# Patient Record
Sex: Female | Born: 1950 | Race: White | Hispanic: No | Marital: Single | Smoking: Never smoker
Health system: Southern US, Community
[De-identification: ages and names within clinical notes are randomized; demographics above are authoritative.]

## PROBLEM LIST (undated history)

## (undated) DIAGNOSIS — D170 Benign lipomatous neoplasm of skin and subcutaneous tissue of head, face and neck: Secondary | ICD-10-CM

## (undated) HISTORY — PX: COLONOSCOPY: SHX174

## (undated) HISTORY — PX: WISDOM TOOTH EXTRACTION: SHX21

---

## 2014-11-13 ENCOUNTER — Other Ambulatory Visit: Payer: Self-pay | Admitting: Family Medicine

## 2014-11-13 ENCOUNTER — Ambulatory Visit
Admission: RE | Admit: 2014-11-13 | Discharge: 2014-11-13 | Disposition: A | Discharge status: Home / Self Care | Payer: 59 | Source: Ambulatory Visit | Attending: Family Medicine | Admitting: Family Medicine

## 2014-11-13 DIAGNOSIS — M20001 Unspecified deformity of right finger(s): Secondary | ICD-10-CM

## 2017-01-27 DIAGNOSIS — N958 Other specified menopausal and perimenopausal disorders: Secondary | ICD-10-CM | POA: Diagnosis not present

## 2017-01-27 DIAGNOSIS — N6320 Unspecified lump in the left breast, unspecified quadrant: Secondary | ICD-10-CM | POA: Diagnosis not present

## 2017-01-27 DIAGNOSIS — M8588 Other specified disorders of bone density and structure, other site: Secondary | ICD-10-CM | POA: Diagnosis not present

## 2017-01-27 DIAGNOSIS — Z13820 Encounter for screening for osteoporosis: Secondary | ICD-10-CM | POA: Diagnosis not present

## 2017-01-27 DIAGNOSIS — Z1231 Encounter for screening mammogram for malignant neoplasm of breast: Secondary | ICD-10-CM | POA: Diagnosis not present

## 2017-01-27 DIAGNOSIS — N952 Postmenopausal atrophic vaginitis: Secondary | ICD-10-CM | POA: Diagnosis not present

## 2017-01-27 DIAGNOSIS — Z01419 Encounter for gynecological examination (general) (routine) without abnormal findings: Secondary | ICD-10-CM | POA: Diagnosis not present

## 2017-01-27 DIAGNOSIS — Z6825 Body mass index (BMI) 25.0-25.9, adult: Secondary | ICD-10-CM | POA: Diagnosis not present

## 2017-01-28 ENCOUNTER — Other Ambulatory Visit: Payer: Self-pay | Admitting: Obstetrics and Gynecology

## 2017-01-28 DIAGNOSIS — R928 Other abnormal and inconclusive findings on diagnostic imaging of breast: Secondary | ICD-10-CM

## 2017-02-01 ENCOUNTER — Ambulatory Visit
Admission: RE | Admit: 2017-02-01 | Discharge: 2017-02-01 | Disposition: A | Discharge status: Home / Self Care | Payer: Medicare Other | Source: Ambulatory Visit | Attending: Obstetrics and Gynecology | Admitting: Obstetrics and Gynecology

## 2017-02-01 DIAGNOSIS — R928 Other abnormal and inconclusive findings on diagnostic imaging of breast: Secondary | ICD-10-CM

## 2017-02-01 DIAGNOSIS — N6489 Other specified disorders of breast: Secondary | ICD-10-CM | POA: Diagnosis not present

## 2017-05-05 DIAGNOSIS — H10413 Chronic giant papillary conjunctivitis, bilateral: Secondary | ICD-10-CM | POA: Diagnosis not present

## 2017-06-22 DIAGNOSIS — Z23 Encounter for immunization: Secondary | ICD-10-CM | POA: Diagnosis not present

## 2017-06-22 DIAGNOSIS — M79672 Pain in left foot: Secondary | ICD-10-CM | POA: Diagnosis not present

## 2017-06-22 DIAGNOSIS — R221 Localized swelling, mass and lump, neck: Secondary | ICD-10-CM | POA: Diagnosis not present

## 2017-06-27 ENCOUNTER — Other Ambulatory Visit: Payer: Self-pay | Admitting: Family Medicine

## 2017-06-27 DIAGNOSIS — R221 Localized swelling, mass and lump, neck: Secondary | ICD-10-CM

## 2017-06-30 ENCOUNTER — Ambulatory Visit
Admission: RE | Admit: 2017-06-30 | Discharge: 2017-06-30 | Disposition: A | Discharge status: Home / Self Care | Payer: Medicare Other | Source: Ambulatory Visit | Attending: Family Medicine | Admitting: Family Medicine

## 2017-06-30 DIAGNOSIS — R221 Localized swelling, mass and lump, neck: Secondary | ICD-10-CM | POA: Diagnosis not present

## 2017-07-04 ENCOUNTER — Other Ambulatory Visit: Payer: Self-pay | Admitting: Family Medicine

## 2017-07-04 DIAGNOSIS — R221 Localized swelling, mass and lump, neck: Secondary | ICD-10-CM

## 2017-07-08 ENCOUNTER — Ambulatory Visit
Admission: RE | Admit: 2017-07-08 | Discharge: 2017-07-08 | Disposition: A | Discharge status: Home / Self Care | Payer: Medicare Other | Source: Ambulatory Visit | Attending: Family Medicine | Admitting: Family Medicine

## 2017-07-08 DIAGNOSIS — R221 Localized swelling, mass and lump, neck: Secondary | ICD-10-CM

## 2017-07-08 DIAGNOSIS — E041 Nontoxic single thyroid nodule: Secondary | ICD-10-CM | POA: Diagnosis not present

## 2017-07-08 MED ORDER — IOPAMIDOL (ISOVUE-300) INJECTION 61%
75.0000 mL | Freq: Once | INTRAVENOUS | Status: AC | PRN
  Administered 2017-07-08: 75 mL via INTRAVENOUS

## 2017-08-08 ENCOUNTER — Ambulatory Visit: Payer: Self-pay | Admitting: Surgery

## 2017-08-08 DIAGNOSIS — D170 Benign lipomatous neoplasm of skin and subcutaneous tissue of head, face and neck: Secondary | ICD-10-CM | POA: Diagnosis not present

## 2017-08-08 NOTE — H&P (Signed)
History of Present Illness Rachel Randall. Rachel Benzel MD; 08/08/2017 10:46 AM) The patient is a 66 year old female who presents with a complaint of Mass. Referred by Dr. Dorthy Cooler for left neck mass  This is a 66 year old female in good health who presents with a palpable mass on the left side of her neck. She first noticed this about a month ago. It has not enlarged since that time. There is minimal tenderness. She underwent a workup including ultrasound as well as CT scan of the neck to confirm that this is likely a subcutaneous lipoma measuring 3 x 2 cm. She presents now to discuss excision.  CLINICAL DATA: 66 year old female with a left anterior palpable abnormality.  EXAM: ULTRASOUND OF HEAD/NECK SOFT TISSUES  TECHNIQUE: Ultrasound examination of the head and neck soft tissues was performed in the area of clinical concern.  COMPARISON: None.  FINDINGS: Sonographic interrogation of the region of clinical concern demonstrates normal structures. The location of the palpable abnormality corresponds with a mildly distended jugular vein. The visualized portion of the thyroid gland is normal. The sternocleidomastoid muscle overlying the jugular vein also appears normal. No soft tissue or cystic mass identified.  IMPRESSION: Negative sonographic survey of the region of clinical concern.  The palpable abnormality appears to correspond with a mildly distended left jugular vein.   Electronically Signed By: Jacqulynn Cadet M.D. On: 06/30/2017 16:36  CLINICAL DATA: Neck nodule  Creatinine was obtained on site at [facility] at [address]Results: Creatinine 0.7 mg/dL.  EXAM: CT NECK WITH CONTRAST  TECHNIQUE: Multidetector CT imaging of the neck was performed using the standard protocol following the bolus administration of intravenous contrast.  CONTRAST: 54mL ISOVUE-300 IOPAMIDOL (ISOVUE-300) INJECTION 61%  COMPARISON: Neck ultrasound  06/30/2017  FINDINGS: Pharynx and larynx: Normal. No mass or swelling.  Salivary glands: No inflammation, mass, or stone.  Thyroid: Negative  Lymph nodes: No enlarged or pathologic lymph nodes  Vascular: Carotid artery and jugular vein patent bilaterally.  Limited intracranial: Negative  Visualized orbits: 90 image  Mastoids and visualized paranasal sinuses: Nodular soft tissue lesion in the right maxillary sinus appears to represent a mucous retention cyst. There are benign-appearing calcifications within the cyst. Air-fluid level right sphenoid sinus. Mastoid sinus clear bilaterally.  Skeleton: Disc degeneration and spurring C6-7. No acute skeletal abnormality.  Upper chest: Lung apices clear  Other: Vitamin-E capsule marks a palpable abnormality in the left anterior neck. This corresponds to a fatty lesion along the anterior margin of the sternocleidomastoid muscle. This has homogeneous fatty density with a thin soft tissue capsule. The lipoma measures 8 x 21 x 28 mm. No soft tissue nodularity or enhancement identified  IMPRESSION: Palpable abnormality corresponds to a lipoma along the anterior margin of the sternocleidomastoid muscle on the left. This has benign imaging characteristics. If this is symptomatic or growing, biopsy may be indicated to rule liposarcoma but there is no evidence of tumor on the imaging.   Electronically Signed By: Franchot Gallo M.D. On: 07/08/2017 16:19   Past Surgical History Alean Rinne, [place]; 08/08/2017 9:14 AM) No pertinent past surgical history  Diagnostic Studies History Alean Rinne, [place]; 08/08/2017 9:14 AM) Colonoscopy 1-5 years ago Mammogram within last year Pap Smear 1-5 years ago  Allergies Alean Rinne, [place]; 08/08/2017 9:16 AM) Penicillins Allergies Reconciled  Medication History Alean Rinne, RMA; 08/08/2017 9:16 AM) Multiple Vitamin (Oral) Active. Medications Reconciled  Social History  Alean Rinne, [place]; 08/08/2017 9:14 AM) Alcohol use Moderate alcohol use. Caffeine use Coffee, Tea. No  drug use Tobacco use Never smoker.  Family History Alean Rinne, [place]; 08/08/2017 9:14 AM) Arthritis Mother.  Pregnancy / Birth History Alean Rinne, [place]; 08/08/2017 9:14 AM) Age at menarche 38 years. Age of menopause 51-55 Contraceptive History Oral contraceptives. Gravida 1 Maternal age 39-25 Para 1  Other Problems Alean Rinne, [place]; 08/08/2017 9:14 AM) Arthritis     Review of Systems Alean Rinne RMA; 08/08/2017 9:14 AM) General Not Present- Appetite Loss, Chills, Fatigue, Fever, Night Sweats, Weight Gain and Weight Loss. HEENT Present- Wears glasses/contact lenses. Not Present- Earache, Hearing Loss, Hoarseness, Nose Bleed, Oral Ulcers, Ringing in the Ears, Seasonal Allergies, Sinus Pain, Sore Throat, Visual Disturbances and Yellow Eyes. Respiratory Not Present- Bloody sputum, Chronic Cough, Difficulty Breathing, Snoring and Wheezing. Breast Not Present- Breast Mass, Breast Pain, Nipple Discharge and Skin Changes. Cardiovascular Not Present- Chest Pain, Difficulty Breathing Lying Down, Leg Cramps, Palpitations, Rapid Heart Rate, Shortness of Breath and Swelling of Extremities. Gastrointestinal Not Present- Abdominal Pain, Bloating, Bloody Stool, Change in Bowel Habits, Chronic diarrhea, Constipation, Difficulty Swallowing, Excessive gas, Gets full quickly at meals, Hemorrhoids, Indigestion, Nausea, Rectal Pain and Vomiting. Female Genitourinary Not Present- Frequency, Nocturia, Painful Urination, Pelvic Pain and Urgency. Musculoskeletal Present- Joint Pain and Joint Stiffness. Not Present- Back Pain, Muscle Pain, Muscle Weakness and Swelling of Extremities. Neurological Not Present- Decreased Memory, Fainting, Headaches, Numbness, Seizures, Tingling, Tremor, Trouble walking and Weakness. Psychiatric Not Present- Anxiety, Bipolar, Change in Sleep Pattern,  Depression, Fearful and Frequent crying. Endocrine Not Present- Cold Intolerance, Excessive Hunger, Hair Changes, Heat Intolerance, Hot flashes and New Diabetes. Hematology Not Present- Blood Thinners, Easy Bruising, Excessive bleeding, Gland problems, HIV and Persistent Infections.  Vitals Mardene Celeste King RMA; 08/08/2017 9:15 AM) 08/08/2017 9:15 AM Weight: 146.8 lb Height: 63in Body Surface Area: 1.7 m Body Mass Index: 26 kg/m  Temp.: 98.73F  Pulse: 77 (Regular)  BP: 120/72 (Sitting, Left Arm, Standard)      Physical Exam Rodman Key K. Leeum Sankey MD; 08/08/2017 10:46 AM)  The physical exam findings are as follows: Note:WDWN in NAD Left neck over L SCM - palpable 3 cm subcutaneous mass - smooth, mobile, no skin changes    Assessment & Plan Rodman Key K. 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 The surgical procedure has been discussed with the patient. Potential risks, benefits, alternative treatments, and expected outcomes have been explained. All of the patient's questions at this time have been answered. The likelihood of reaching the patient's treatment goal is good. The patient understand the proposed surgical procedure and wishes to proceed.  Rachel Randall. Georgette Dover, MD, [facility]  General/ Trauma Surgery  08/08/2017 10:47 AM

## 2017-10-12 ENCOUNTER — Encounter (HOSPITAL_BASED_OUTPATIENT_CLINIC_OR_DEPARTMENT_OTHER): Payer: Self-pay | Admitting: *Deleted

## 2017-10-12 ENCOUNTER — Other Ambulatory Visit: Payer: Self-pay

## 2017-10-19 ENCOUNTER — Ambulatory Visit (HOSPITAL_BASED_OUTPATIENT_CLINIC_OR_DEPARTMENT_OTHER)
Admission: RE | Admit: 2017-10-19 | Discharge: 2017-10-19 | Disposition: A | Discharge status: Home / Self Care | Payer: Medicare Other | Source: Ambulatory Visit | Attending: Surgery | Admitting: Surgery

## 2017-10-19 ENCOUNTER — Encounter (HOSPITAL_BASED_OUTPATIENT_CLINIC_OR_DEPARTMENT_OTHER): Payer: Self-pay | Admitting: Anesthesiology

## 2017-10-19 ENCOUNTER — Encounter (HOSPITAL_BASED_OUTPATIENT_CLINIC_OR_DEPARTMENT_OTHER)
Admission: RE | Disposition: A | Discharge status: Home / Self Care | Payer: Self-pay | Source: Ambulatory Visit | Attending: Surgery

## 2017-10-19 ENCOUNTER — Ambulatory Visit (HOSPITAL_BASED_OUTPATIENT_CLINIC_OR_DEPARTMENT_OTHER): Payer: Medicare Other | Admitting: Anesthesiology

## 2017-10-19 ENCOUNTER — Other Ambulatory Visit: Payer: Self-pay

## 2017-10-19 DIAGNOSIS — D1779 Benign lipomatous neoplasm of other sites: Secondary | ICD-10-CM | POA: Insufficient documentation

## 2017-10-19 DIAGNOSIS — Z79899 Other long term (current) drug therapy: Secondary | ICD-10-CM | POA: Insufficient documentation

## 2017-10-19 DIAGNOSIS — D170 Benign lipomatous neoplasm of skin and subcutaneous tissue of head, face and neck: Secondary | ICD-10-CM | POA: Diagnosis not present

## 2017-10-19 HISTORY — DX: Benign lipomatous neoplasm of skin and subcutaneous tissue of head, face and neck: D17.0

## 2017-10-19 HISTORY — PX: LIPOMA EXCISION: SHX5283

## 2017-10-19 SURGERY — EXCISION LIPOMA
Anesthesia: General | Site: Neck | Laterality: Left

## 2017-10-19 MED ORDER — LACTATED RINGERS IV SOLN
INTRAVENOUS | Status: DC
  Administered 2017-10-19 (×3): via INTRAVENOUS

## 2017-10-19 MED ORDER — OXYCODONE HCL 5 MG PO TABS: 5.0000 mg | ORAL_TABLET | Freq: Once | ORAL | Status: DC | PRN

## 2017-10-19 MED ORDER — CEFAZOLIN SODIUM-DEXTROSE 2-4 GM/100ML-% IV SOLN
INTRAVENOUS | Status: AC
  Filled 2017-10-19: qty 100

## 2017-10-19 MED ORDER — DEXAMETHASONE SODIUM PHOSPHATE 10 MG/ML IJ SOLN
INTRAMUSCULAR | Status: AC
  Filled 2017-10-19: qty 1

## 2017-10-19 MED ORDER — MEPERIDINE HCL 25 MG/ML IJ SOLN: 6.2500 mg | INTRAMUSCULAR | Status: DC | PRN

## 2017-10-19 MED ORDER — PROMETHAZINE HCL 25 MG/ML IJ SOLN: 6.2500 mg | INTRAMUSCULAR | Status: DC | PRN

## 2017-10-19 MED ORDER — CEFAZOLIN SODIUM-DEXTROSE 2-4 GM/100ML-% IV SOLN
2.0000 g | INTRAVENOUS | Status: AC
  Administered 2017-10-19: 2 g via INTRAVENOUS

## 2017-10-19 MED ORDER — HYDROCODONE-ACETAMINOPHEN 5-325 MG PO TABS: 1.0000 | ORAL_TABLET | Freq: Four times a day (QID) | ORAL | 0 refills | Status: DC | PRN

## 2017-10-19 MED ORDER — PHENYLEPHRINE HCL 10 MG/ML IJ SOLN
INTRAMUSCULAR | Status: DC | PRN
  Administered 2017-10-19: 160 ug via INTRAVENOUS

## 2017-10-19 MED ORDER — CHLORHEXIDINE GLUCONATE CLOTH 2 % EX PADS: 6.0000 | MEDICATED_PAD | Freq: Once | CUTANEOUS | Status: DC

## 2017-10-19 MED ORDER — ONDANSETRON HCL 4 MG/2ML IJ SOLN
INTRAMUSCULAR | Status: AC
  Filled 2017-10-19: qty 2

## 2017-10-19 MED ORDER — FENTANYL CITRATE (PF) 100 MCG/2ML IJ SOLN: 50.0000 ug | INTRAMUSCULAR | Status: DC | PRN

## 2017-10-19 MED ORDER — FENTANYL CITRATE (PF) 100 MCG/2ML IJ SOLN
INTRAMUSCULAR | Status: DC | PRN
  Administered 2017-10-19: 100 ug via INTRAVENOUS

## 2017-10-19 MED ORDER — SCOPOLAMINE 1 MG/3DAYS TD PT72: 1.0000 | MEDICATED_PATCH | Freq: Once | TRANSDERMAL | Status: DC | PRN

## 2017-10-19 MED ORDER — ONDANSETRON HCL 4 MG/2ML IJ SOLN
INTRAMUSCULAR | Status: DC | PRN
  Administered 2017-10-19: 4 mg via INTRAVENOUS

## 2017-10-19 MED ORDER — FENTANYL CITRATE (PF) 100 MCG/2ML IJ SOLN: 25.0000 ug | INTRAMUSCULAR | Status: DC | PRN

## 2017-10-19 MED ORDER — PROPOFOL 10 MG/ML IV BOLUS
INTRAVENOUS | Status: DC | PRN
  Administered 2017-10-19: 200 mg via INTRAVENOUS

## 2017-10-19 MED ORDER — DEXAMETHASONE SODIUM PHOSPHATE 4 MG/ML IJ SOLN
INTRAMUSCULAR | Status: DC | PRN
  Administered 2017-10-19: 10 mg via INTRAVENOUS

## 2017-10-19 MED ORDER — LIDOCAINE HCL (CARDIAC) 20 MG/ML IV SOLN
INTRAVENOUS | Status: DC | PRN
  Administered 2017-10-19: 30 mg via INTRAVENOUS

## 2017-10-19 MED ORDER — PROPOFOL 500 MG/50ML IV EMUL
INTRAVENOUS | Status: AC
  Filled 2017-10-19: qty 50

## 2017-10-19 MED ORDER — MIDAZOLAM HCL 2 MG/2ML IJ SOLN: 1.0000 mg | INTRAMUSCULAR | Status: DC | PRN

## 2017-10-19 MED ORDER — FENTANYL CITRATE (PF) 100 MCG/2ML IJ SOLN
INTRAMUSCULAR | Status: AC
  Filled 2017-10-19: qty 2

## 2017-10-19 MED ORDER — PROPOFOL 500 MG/50ML IV EMUL
INTRAVENOUS | Status: AC
  Filled 2017-10-19: qty 100

## 2017-10-19 MED ORDER — BUPIVACAINE-EPINEPHRINE 0.25% -1:200000 IJ SOLN
INTRAMUSCULAR | Status: DC | PRN
  Administered 2017-10-19: 7 mL

## 2017-10-19 MED ORDER — MIDAZOLAM HCL 2 MG/2ML IJ SOLN
INTRAMUSCULAR | Status: AC
  Filled 2017-10-19: qty 2

## 2017-10-19 MED ORDER — MIDAZOLAM HCL 5 MG/5ML IJ SOLN
INTRAMUSCULAR | Status: DC | PRN
  Administered 2017-10-19: 2 mg via INTRAVENOUS

## 2017-10-19 MED ORDER — LIDOCAINE 2% (20 MG/ML) 5 ML SYRINGE
INTRAMUSCULAR | Status: AC
  Filled 2017-10-19: qty 5

## 2017-10-19 MED ORDER — OXYCODONE HCL 5 MG/5ML PO SOLN: 5.0000 mg | Freq: Once | ORAL | Status: DC | PRN

## 2017-10-19 MED ORDER — PHENYLEPHRINE 40 MCG/ML (10ML) SYRINGE FOR IV PUSH (FOR BLOOD PRESSURE SUPPORT)
PREFILLED_SYRINGE | INTRAVENOUS | Status: AC
  Filled 2017-10-19: qty 10

## 2017-10-19 SURGICAL SUPPLY — 47 items
BENZOIN TINCTURE PRP APPL 2/3 (GAUZE/BANDAGES/DRESSINGS) ×3 IMPLANT
BLADE CLIPPER SURG (BLADE) IMPLANT
BLADE SURG 15 STRL LF DISP TIS (BLADE) ×1 IMPLANT
BLADE SURG 15 STRL SS (BLADE) ×2
CANISTER SUCT 1200ML W/VALVE (MISCELLANEOUS) IMPLANT
CHLORAPREP W/TINT 26ML (MISCELLANEOUS) ×3 IMPLANT
CLOSURE WOUND 1/2 X4 (GAUZE/BANDAGES/DRESSINGS) ×1
COVER BACK TABLE 60X90IN (DRAPES) ×3 IMPLANT
COVER MAYO STAND STRL (DRAPES) ×3 IMPLANT
DECANTER SPIKE VIAL GLASS SM (MISCELLANEOUS) IMPLANT
DRAPE LAPAROTOMY 100X72 PEDS (DRAPES) ×3 IMPLANT
DRAPE UTILITY XL STRL (DRAPES) ×3 IMPLANT
DRSG TEGADERM 4X4.75 (GAUZE/BANDAGES/DRESSINGS) ×3 IMPLANT
ELECT COATED BLADE 2.86 ST (ELECTRODE) ×3 IMPLANT
ELECT REM PT RETURN 9FT ADLT (ELECTROSURGICAL) ×3
ELECTRODE REM PT RTRN 9FT ADLT (ELECTROSURGICAL) ×1 IMPLANT
GAUZE SPONGE 4X4 12PLY STRL LF (GAUZE/BANDAGES/DRESSINGS) IMPLANT
GLOVE BIO SURGEON STRL SZ 6.5 (GLOVE) ×2 IMPLANT
GLOVE BIO SURGEON STRL SZ7 (GLOVE) ×3 IMPLANT
GLOVE BIO SURGEONS STRL SZ 6.5 (GLOVE) ×1
GLOVE BIOGEL PI IND STRL 7.0 (GLOVE) ×2 IMPLANT
GLOVE BIOGEL PI IND STRL 7.5 (GLOVE) ×1 IMPLANT
GLOVE BIOGEL PI INDICATOR 7.0 (GLOVE) ×4
GLOVE BIOGEL PI INDICATOR 7.5 (GLOVE) ×2
GOWN STRL REUS W/ TWL LRG LVL3 (GOWN DISPOSABLE) ×2 IMPLANT
GOWN STRL REUS W/TWL LRG LVL3 (GOWN DISPOSABLE) ×4
NEEDLE HYPO 25X1 1.5 SAFETY (NEEDLE) ×3 IMPLANT
NS IRRIG 1000ML POUR BTL (IV SOLUTION) IMPLANT
PACK BASIN DAY SURGERY FS (CUSTOM PROCEDURE TRAY) ×3 IMPLANT
PENCIL BUTTON HOLSTER BLD 10FT (ELECTRODE) ×3 IMPLANT
SLEEVE SCD COMPRESS KNEE MED (MISCELLANEOUS) IMPLANT
SPONGE GAUZE 2X2 8PLY STER LF (GAUZE/BANDAGES/DRESSINGS)
SPONGE GAUZE 2X2 8PLY STRL LF (GAUZE/BANDAGES/DRESSINGS) IMPLANT
STRIP CLOSURE SKIN 1/2X4 (GAUZE/BANDAGES/DRESSINGS) ×2 IMPLANT
SUT MON AB 4-0 PC3 18 (SUTURE) IMPLANT
SUT PROLENE 6 0 P 1 18 (SUTURE) IMPLANT
SUT SILK 2 0 PERMA HAND 18 BK (SUTURE) IMPLANT
SUT VIC AB 3-0 SH 27 (SUTURE)
SUT VIC AB 3-0 SH 27X BRD (SUTURE) IMPLANT
SUT VICRYL 3-0 CR8 SH (SUTURE) IMPLANT
SYR BULB 3OZ (MISCELLANEOUS) ×3 IMPLANT
SYR CONTROL 10ML LL (SYRINGE) ×3 IMPLANT
TOWEL OR 17X24 6PK STRL BLUE (TOWEL DISPOSABLE) ×3 IMPLANT
TOWEL OR NON WOVEN STRL DISP B (DISPOSABLE) ×3 IMPLANT
TUBE CONNECTING 20'X1/4 (TUBING)
TUBE CONNECTING 20X1/4 (TUBING) IMPLANT
YANKAUER SUCT BULB TIP NO VENT (SUCTIONS) IMPLANT

## 2017-10-19 NOTE — Op Note (Signed)
Preop diagnosis: Subcutaneous lipoma left neck (3 x 2 cm) Postop diagnosis: Subplatysmal lipoma left neck ( 3 x 2 cm) Procedure performed: Excision of subfascial lipoma left neck Surgeon: Maia Petties, MD Anesthesia: General via LMA Indications:This is a 67 year old female in good health who presents with a palpable mass on the left side of her neck. She first noticed this about a month ago. It has not enlarged since that time. There is minimal tenderness. She underwent a workup including ultrasound as well as CT scan of the neck to confirm that this is likely a subcutaneous lipoma measuring 3 x 2 cm. She presents now to discuss excision  Description of procedure: The patient is brought to the operating room and placed in the supine position on the operating room table.  After an adequate level of general anesthesia was obtained, her head was turned to the right.  Her left neck was prepped with ChloraPrep and draped in sterile fashion.  A timeout was taken to ensure the proper patient and proper procedure.  The patient has a natural skin fold that goes over the middle of this mass.  We anesthetized with 0.25% Marcaine with epinephrine and then I made a transverse incision along this natural skin line.  We dissected down to the platysma.  The platysma fibers were divided.  We then bluntly dissected around the underlying mass and dissected off of the underlying muscle.  The mass was removed entirely intact and was sent for pathologic examination.  Grossly, it appears to be a lipoma.  We examined for hemostasis.  The wound was closed with a platysmal layer of 3-0 Vicryl and a subcuticular layer 4-0 Monocryl.  Benzoin Steri-Strips were applied.  The patient was then extubated and brought to recovery room in stable condition.  All sponge, instrument, and needle counts are correct.  Imogene Burn. Georgette Dover, MD, [facility]  General/ Trauma Surgery  10/19/2017 10:15 AM

## 2017-10-19 NOTE — Transfer of Care (Signed)
Immediate Anesthesia Transfer of Care Note  Patient: Rachel Randall  Procedure(s) Performed: EXCISION OF LEFT NECK LIPOMA (Left Neck)  Patient Location: PACU  Anesthesia Type:General  Level of Consciousness: sedated  Airway & Oxygen Therapy: Patient Spontanous Breathing and Patient connected to face mask oxygen  Post-op Assessment: Report given to RN and Post -op Vital signs reviewed and stable  Post vital signs: Reviewed and stable  Last Vitals:  Vitals:   10/19/17 0830  BP: (!) 118/93  Pulse: (!) 52  Resp: 16  Temp: 36.6 C  SpO2: 100%    Last Pain:  Vitals:   10/19/17 0830  TempSrc: Oral         Complications: No apparent anesthesia complications

## 2017-10-19 NOTE — Anesthesia Procedure Notes (Signed)
Procedure Name: LMA Insertion Date/Time: 10/19/2017 9:25 AM Performed by: Marrianne Mood, CRNA Pre-anesthesia Checklist: Patient identified, Emergency Drugs available, Suction available, Patient being monitored and Timeout performed Patient Re-evaluated:Patient Re-evaluated prior to induction Oxygen Delivery Method: Circle system utilized Preoxygenation: Pre-oxygenation with 100% oxygen Induction Type: IV induction Ventilation: Mask ventilation without difficulty LMA: LMA inserted LMA Size: 4.0 Number of attempts: 1 Airway Equipment and Method: Bite block Placement Confirmation: positive ETCO2 Tube secured with: Tape Dental Injury: Teeth and Oropharynx as per pre-operative assessment

## 2017-10-19 NOTE — Discharge Instructions (Signed)
[facility] °Office Phone Number [phone] ° °Lipoma Excision: POST OP INSTRUCTIONS ° °Always review your discharge instruction sheet given to you by the facility where your surgery was performed. ° °IF YOU HAVE DISABILITY OR FAMILY LEAVE FORMS, YOU MUST BRING THEM TO THE OFFICE FOR PROCESSING.  DO NOT GIVE THEM TO YOUR DOCTOR. ° °1. A prescription for pain medication may be given to you upon discharge.  Take your pain medication as prescribed, if needed.  If narcotic pain medicine is not needed, then you may take acetaminophen (Tylenol) or ibuprofen (Advil) as needed. °2. Take your usually prescribed medications unless otherwise directed °3. If you need a refill on your pain medication, please contact your pharmacy.  They will contact our office to request authorization.  Prescriptions will not be filled after 5pm or on week-ends. °4. You should eat very light the first 24 hours after surgery, such as soup, crackers, pudding, etc.  Resume your normal diet the [place] after surgery. °5. Most patients will experience some swelling and bruising around the surgical site.  Ice packs will help.  Swelling and bruising can take several days to resolve.  °6. It is common to experience some constipation if taking pain medication after surgery.  Increasing fluid intake and taking a stool softener will usually help or prevent this problem from occurring.  A mild laxative (Milk of Magnesia or Miralax) should be taken according to package directions if there are no bowel movements after 48 hours. °7. You may remove your bandages 48 hours after surgery, and you may shower at that time.  You will have steri-strips (small skin tapes) in place directly over the incision.  These strips should be left on the skin for 7-10 days.   °8. ACTIVITIES:  You may resume regular daily activities (gradually increasing) beginning the next [place].   You may have sexual intercourse when it is comfortable. °a. You may drive when you no  longer are taking prescription pain medication, you can comfortably wear a seatbelt, and you can safely maneuver your car and apply brakes. °b. RETURN TO WORK:  1-2 weeks °9. You should see your doctor in the office for a follow-up appointment approximately two to three weeks after your surgery.   ° °WHEN TO CALL YOUR DOCTOR: °1. Fever over 101.0 °2. Nausea and/or vomiting. °3. Extreme swelling or bruising. °4. Continued bleeding from incision. °5. Increased pain, redness, or drainage from the incision. ° °The clinic staff is available to answer your questions during regular business hours.  Please don’t hesitate to call and ask to speak to one of the nurses for clinical concerns.  If you have a medical emergency, go to the nearest emergency room or call 911.  A surgeon from [facility] is always on call at the hospital. ° °For further questions, please visit [email or web address]  ° ° ° ° ° °Post Anesthesia Home Care Instructions ° °Activity: °Get plenty of rest for the remainder of the [place]. A responsible individual must stay with you for 24 hours following the procedure.  °For the next 24 hours, DO NOT: °-Drive a car °-Operate machinery °-Drink alcoholic beverages °-Take any medication unless instructed by your physician °-Make any legal decisions or sign important papers. ° °Meals: °Start with liquid foods such as gelatin or soup. Progress to regular foods as tolerated. Avoid greasy, spicy, heavy foods. If nausea and/or vomiting occur, drink only clear liquids until the nausea and/or vomiting subsides. Call your physician if vomiting continues. ° °  Special Instructions/Symptoms: °Your throat may feel dry or sore from the anesthesia or the breathing tube placed in your throat during surgery. If this causes discomfort, gargle with warm salt water. The discomfort should disappear within 24 hours. ° °If you had a scopolamine patch placed behind your ear for the management of post- operative nausea  and/or vomiting: ° °1. The medication in the patch is effective for 72 hours, after which it should be removed.  Wrap patch in a tissue and discard in the trash. Wash hands thoroughly with soap and water. °2. You may remove the patch earlier than 72 hours if you experience unpleasant side effects which may include dry mouth, dizziness or visual disturbances. °3. Avoid touching the patch. Wash your hands with soap and water after contact with the patch. °  ° °

## 2017-10-19 NOTE — H&P (Signed)
History of Present Illness The patient is a 67 year old female who presents with a complaint of Mass. Referred by Dr. Dorthy Cooler for left neck mass  This is a 67 year old female in good health who presents with a palpable mass on the left side of her neck. She first noticed this about a month ago. It has not enlarged since that time. There is minimal tenderness. She underwent a workup including ultrasound as well as CT scan of the neck to confirm that this is likely a subcutaneous lipoma measuring 3 x 2 cm. She presents now to discuss excision.  CLINICAL DATA: 67 year old female with a left anterior palpable  abnormality.  EXAM:  ULTRASOUND OF HEAD/NECK SOFT TISSUES  TECHNIQUE:  Ultrasound examination of the head and neck soft tissues was  performed in the area of clinical concern.  COMPARISON: None.  FINDINGS:  Sonographic interrogation of the region of clinical concern  demonstrates normal structures. The location of the palpable  abnormality corresponds with a mildly distended jugular vein. The  visualized portion of the thyroid gland is normal. The  sternocleidomastoid muscle overlying the jugular vein also appears  normal. No soft tissue or cystic mass identified.  IMPRESSION:  Negative sonographic survey of the region of clinical concern.  The palpable abnormality appears to correspond with a mildly  distended left jugular vein.  Electronically Signed  By: Jacqulynn Cadet M.D.  On: 06/30/2017 16:36  CLINICAL DATA: Neck nodule  Creatinine was obtained on site at [facility] at [address]Results: Creatinine 0.7 mg/dL.  EXAM:  CT NECK WITH CONTRAST  TECHNIQUE:  Multidetector CT imaging of the neck was performed using the  standard protocol following the bolus administration of intravenous  contrast.  CONTRAST: 31mL ISOVUE-300 IOPAMIDOL (ISOVUE-300) INJECTION 61%  COMPARISON: Neck ultrasound 06/30/2017  FINDINGS:  Pharynx and larynx: Normal. No mass or  swelling.  Salivary glands: No inflammation, mass, or stone.  Thyroid: Negative  Lymph nodes: No enlarged or pathologic lymph nodes  Vascular: Carotid artery and jugular vein patent bilaterally.  Limited intracranial: Negative  Visualized orbits: 90 image  Mastoids and visualized paranasal sinuses: Nodular soft tissue  lesion in the right maxillary sinus appears to represent a mucous  retention cyst. There are benign-appearing calcifications within the  cyst. Air-fluid level right sphenoid sinus. Mastoid sinus clear  bilaterally.  Skeleton: Disc degeneration and spurring C6-7. No acute skeletal  abnormality.  Upper chest: Lung apices clear  Other: Vitamin-E capsule marks a palpable abnormality in the left  anterior neck. This corresponds to a fatty lesion along the anterior  margin of the sternocleidomastoid muscle. This has homogeneous fatty  density with a thin soft tissue capsule. The lipoma measures 8 x 21  x 28 mm. No soft tissue nodularity or enhancement identified  IMPRESSION:  Palpable abnormality corresponds to a lipoma along the anterior  margin of the sternocleidomastoid muscle on the left. This has  benign imaging characteristics. If this is symptomatic or growing,  biopsy may be indicated to rule liposarcoma but there is no evidence  of tumor on the imaging.  Electronically Signed  By: Franchot Gallo M.D.  On: 07/08/2017 16:19  Past Surgical History  No pertinent past surgical history  Diagnostic Studies History  Colonoscopy 1-5 years ago  Mammogram within last year  Pap Smear 1-5 years ago  Allergies Penicillins  Allergies Reconciled  Medication History  Multiple Vitamin (Oral) Active.  Medications Reconciled  Social History  Alcohol use Moderate alcohol use.  Caffeine  use Coffee, Tea.  No drug use  Tobacco use Never smoker.  Family History Arthritis Mother.  Pregnancy / Birth History  Age at menarche 77 years.  Age of menopause 51-55  Contraceptive  History Oral contraceptives.  Gravida 1  Maternal age 23-25  Para 1  Other Problems  Arthritis  Review of Systems  General Not Present- Appetite Loss, Chills, Fatigue, Fever, Night Sweats, Weight Gain and Weight Loss.  HEENT Present- Wears glasses/contact lenses. Not Present- Earache, Hearing Loss, Hoarseness, Nose Bleed, Oral Ulcers, Ringing in the Ears, Seasonal Allergies, Sinus Pain, Sore Throat, Visual Disturbances and Yellow Eyes.  Respiratory Not Present- Bloody sputum, Chronic Cough, Difficulty Breathing, Snoring and Wheezing.  Breast Not Present- Breast Mass, Breast Pain, Nipple Discharge and Skin Changes.  Cardiovascular Not Present- Chest Pain, Difficulty Breathing Lying Down, Leg Cramps, Palpitations, Rapid Heart Rate, Shortness of Breath and Swelling of Extremities.  Gastrointestinal Not Present- Abdominal Pain, Bloating, Bloody Stool, Change in Bowel Habits, Chronic diarrhea, Constipation, Difficulty Swallowing, Excessive gas, Gets full quickly at meals, Hemorrhoids, Indigestion, Nausea, Rectal Pain and Vomiting.  Female Genitourinary Not Present- Frequency, Nocturia, Painful Urination, Pelvic Pain and Urgency.  Musculoskeletal Present- Joint Pain and Joint Stiffness. Not Present- Back Pain, Muscle Pain, Muscle Weakness and Swelling of Extremities.  Neurological Not Present- Decreased Memory, Fainting, Headaches, Numbness, Seizures, Tingling, Tremor, Trouble walking and Weakness.  Psychiatric Not Present- Anxiety, Bipolar, Change in Sleep Pattern, Depression, Fearful and Frequent crying.  Endocrine Not Present- Cold Intolerance, Excessive Hunger, Hair Changes, Heat Intolerance, Hot flashes and New Diabetes.  Hematology Not Present- Blood Thinners, Easy Bruising, Excessive bleeding, Gland problems, HIV and Persistent Infections.  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 The surgical procedure has been discussed with the patient. Potential risks, benefits, alternative treatments, and expected outcomes have been explained. All of the patient's questions at this time have been answered. The likelihood of reaching the patient's treatment goal is good. The patient understand the proposed surgical procedure and wishes to proceed.   Imogene Burn. Georgette Dover, MD, [facility]  General/ Trauma Surgery  10/19/2017 8:15 AM

## 2017-10-19 NOTE — Anesthesia Preprocedure Evaluation (Signed)
Anesthesia Evaluation  Patient identified by MRN, date of birth, ID band Patient awake    Reviewed: Allergy & Precautions, NPO status , Patient's Chart, lab work & pertinent test results  Airway Mallampati: II  TM Distance: >3 FB Neck ROM: Full    Dental no notable dental hx.    Pulmonary neg pulmonary ROS,    Pulmonary exam normal breath sounds clear to auscultation       Cardiovascular negative cardio ROS Normal cardiovascular exam Rhythm:Regular Rate:Normal     Neuro/Psych negative neurological ROS  negative psych ROS   GI/Hepatic negative GI ROS, Neg liver ROS,   Endo/Other  negative endocrine ROS  Renal/GU negative Renal ROS     Musculoskeletal negative musculoskeletal ROS (+)   Abdominal   Peds  Hematology negative hematology ROS (+)   Anesthesia Other Findings   Reproductive/Obstetrics negative OB ROS                             Anesthesia Physical Anesthesia Plan  ASA: II  Anesthesia Plan: General   Post-op Pain Management:    Induction: Intravenous  PONV Risk Score and Plan: 3 and Ondansetron, Dexamethasone, Midazolam and Treatment may vary due to age or medical condition  Airway Management Planned: LMA and Oral ETT  Additional Equipment:   Intra-op Plan:   Post-operative Plan: Extubation in OR  Informed Consent: I have reviewed the patients History and Physical, chart, labs and discussed the procedure including the risks, benefits and alternatives for the proposed anesthesia with the patient or authorized representative who has indicated his/her understanding and acceptance.   Dental advisory given  Plan Discussed with: CRNA  Anesthesia Plan Comments:         Anesthesia Quick Evaluation

## 2017-10-19 NOTE — Anesthesia Postprocedure Evaluation (Signed)
Anesthesia Post Note  Patient: Rachel Randall  Procedure(s) Performed: EXCISION OF LEFT NECK LIPOMA (Left Neck)     Patient location during evaluation: PACU Anesthesia Type: General Level of consciousness: sedated and patient cooperative Pain management: pain level controlled Vital Signs Assessment: post-procedure vital signs reviewed and stable Respiratory status: spontaneous breathing Cardiovascular status: stable Anesthetic complications: no    Last Vitals:  Vitals:   10/19/17 1130 10/19/17 1145  BP: 97/60 (!) 118/57  Pulse: (!) 51 61  Resp: 20 18  Temp: (!) 36.3 C   SpO2: 96% 99%    Last Pain:  Vitals:   10/19/17 1145  TempSrc:   PainSc: 0-No pain                 Nolon Nations

## 2017-10-20 ENCOUNTER — Encounter (HOSPITAL_BASED_OUTPATIENT_CLINIC_OR_DEPARTMENT_OTHER): Payer: Self-pay | Admitting: Surgery

## 2017-11-11 DIAGNOSIS — E7800 Pure hypercholesterolemia, unspecified: Secondary | ICD-10-CM | POA: Diagnosis not present

## 2017-11-11 DIAGNOSIS — R7301 Impaired fasting glucose: Secondary | ICD-10-CM | POA: Diagnosis not present

## 2017-11-11 DIAGNOSIS — Z23 Encounter for immunization: Secondary | ICD-10-CM | POA: Diagnosis not present

## 2017-11-11 DIAGNOSIS — Z0000 Encounter for general adult medical examination without abnormal findings: Secondary | ICD-10-CM | POA: Diagnosis not present

## 2018-01-31 DIAGNOSIS — Z01419 Encounter for gynecological examination (general) (routine) without abnormal findings: Secondary | ICD-10-CM | POA: Diagnosis not present

## 2018-01-31 DIAGNOSIS — Z1231 Encounter for screening mammogram for malignant neoplasm of breast: Secondary | ICD-10-CM | POA: Diagnosis not present

## 2018-01-31 DIAGNOSIS — Z6825 Body mass index (BMI) 25.0-25.9, adult: Secondary | ICD-10-CM | POA: Diagnosis not present

## 2018-02-23 IMAGING — CT CT NECK W/ CM
2 of 3 series · 8 of 14 positions shown, 9 images · IV contrast (iopamidol)
Comparison: Neck ultrasound 06/30/2017

CLINICAL DATA: Neck nodule

Creatinine was obtained on site at [HOSPITAL] at [HOSPITAL].Results: Creatinine 0.7 mg/dL.
EXAM:
CT NECK WITH CONTRAST
TECHNIQUE: Multidetector CT imaging of the neck was performed using the
standard protocol following the bolus administration of intravenous
contrast.
CONTRAST:  75mL [id number] IOPAMIDOL ([id number]) INJECTION 61%

[Series 3: neck · axial · 0.38mm/px · z∈[-267,-133]mm · 4 of 113 slices shown, 5 images]
[im 23/113  soft-tissue]
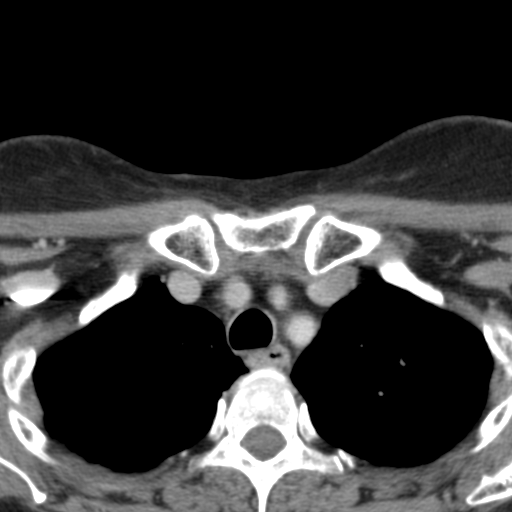
[im 23/113  bone]
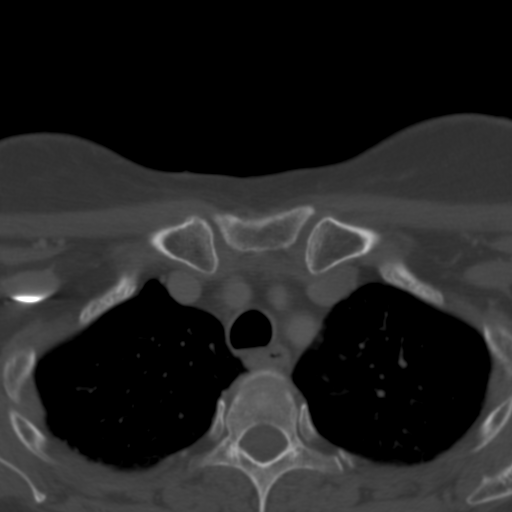
[im 45/113  bone]
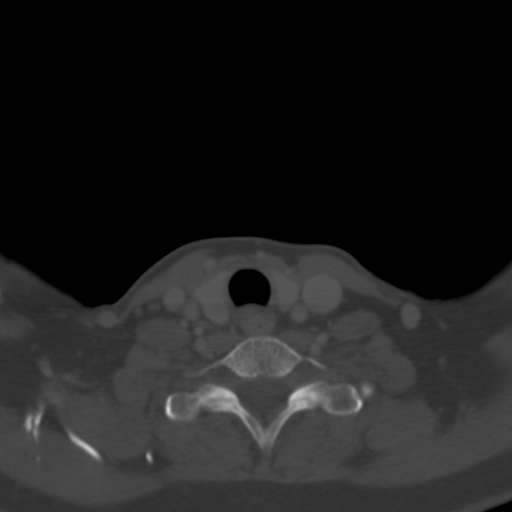
[im 68/113  bone]
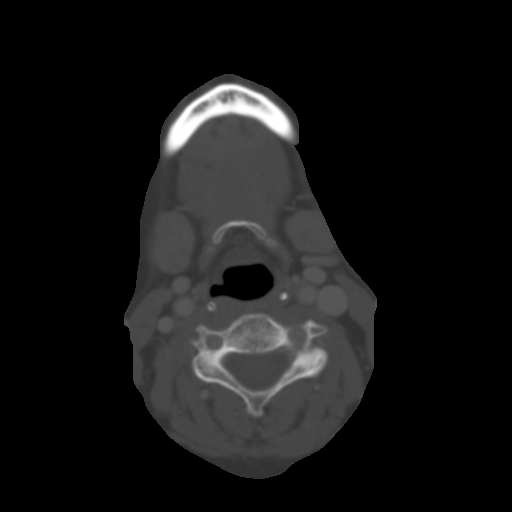
[im 90/113  bone]
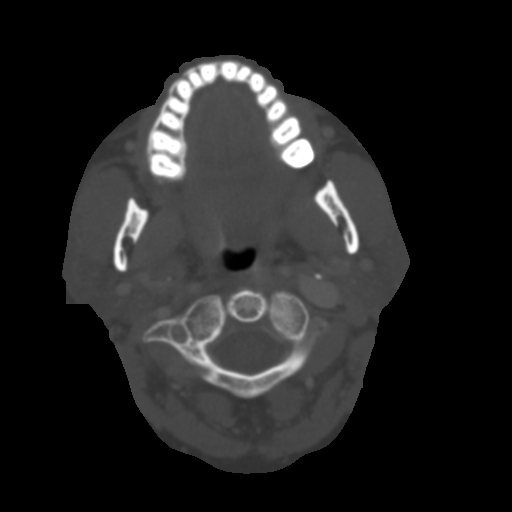

[Series 8: angled axial-oropharynx · axial · 0.37mm/px · z∈[-277,-139]mm · 4 of 118 slices shown]
[im 24/118  bone]
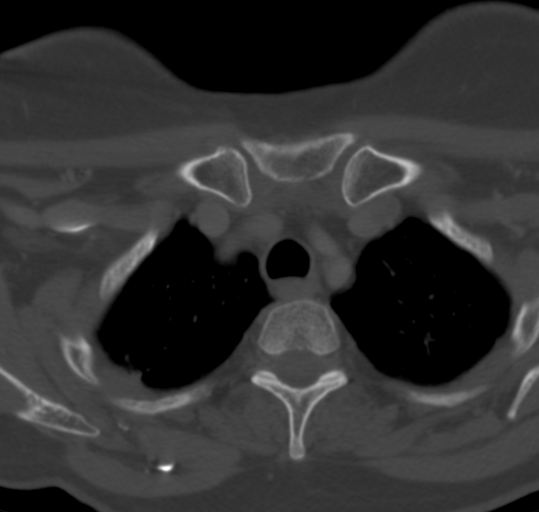
[im 47/118  bone]
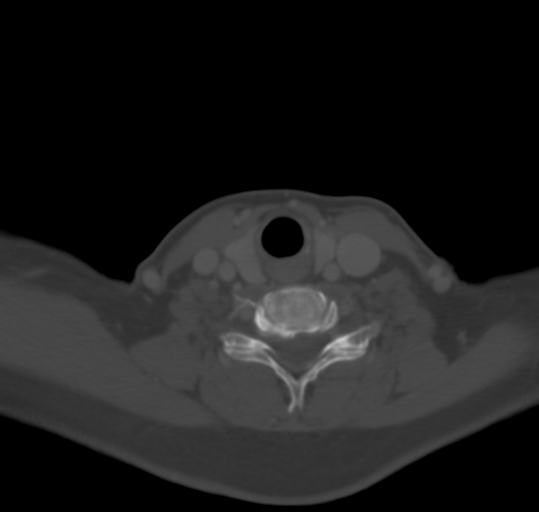
[im 71/118  bone]
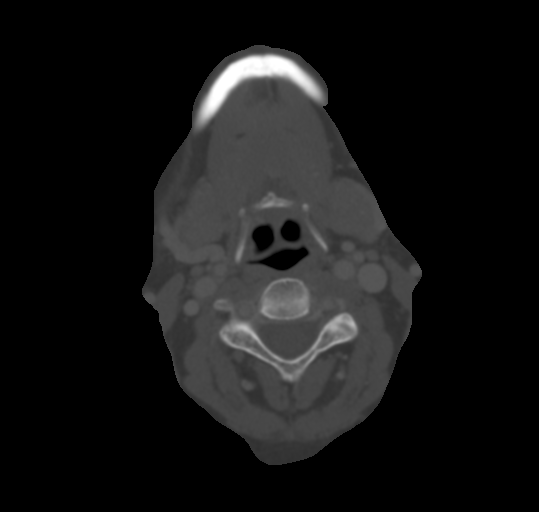
[im 94/118  bone]
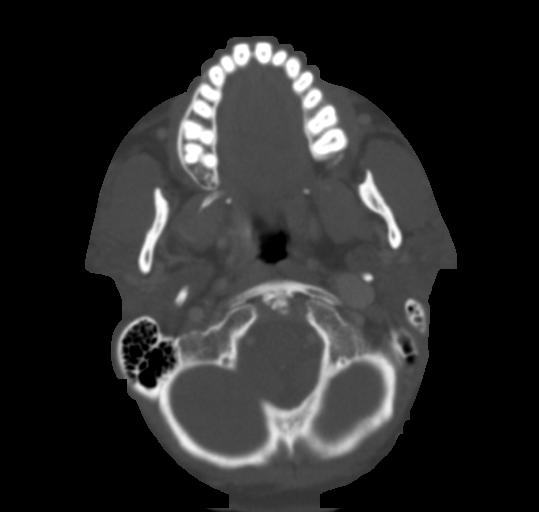

[8 of 14 positions shown; findings below may reference images not displayed]

FINDINGS: Pharynx and larynx: Normal. No mass or swelling.

Salivary glands: No inflammation, mass, or stone.

Thyroid: Negative

Lymph nodes: No enlarged or pathologic lymph nodes

Vascular: Carotid artery and jugular vein patent bilaterally.

Limited intracranial: Negative

Visualized orbits: 90 image

Mastoids and visualized paranasal sinuses: Nodular soft tissue
lesion in the right maxillary sinus appears to represent a mucous
retention cyst. There are benign-appearing calcifications within the
cyst. Air-fluid level right sphenoid sinus. Mastoid sinus clear
bilaterally.

Skeleton: Disc degeneration and spurring C6-7. No acute skeletal
abnormality.

Upper chest: Lung apices clear

Other: Vitamin-E capsule marks a palpable abnormality in the left
anterior neck. This corresponds to a fatty lesion along the anterior
margin of the sternocleidomastoid muscle. This has homogeneous fatty
density with a thin soft tissue capsule. The lipoma measures 8 x 21
x 28 mm. No soft tissue nodularity or enhancement identified
IMPRESSION: Palpable abnormality corresponds to a lipoma along the anterior
margin of the sternocleidomastoid muscle on the left. This has
benign imaging characteristics. If this is symptomatic or growing,
biopsy may be indicated to rule liposarcoma but there is no evidence
of tumor on the imaging.

## 2018-08-04 DIAGNOSIS — M7022 Olecranon bursitis, left elbow: Secondary | ICD-10-CM | POA: Diagnosis not present

## 2018-08-04 DIAGNOSIS — Z23 Encounter for immunization: Secondary | ICD-10-CM | POA: Diagnosis not present

## 2018-08-30 DIAGNOSIS — M7022 Olecranon bursitis, left elbow: Secondary | ICD-10-CM | POA: Diagnosis not present

## 2018-11-15 DIAGNOSIS — Z79899 Other long term (current) drug therapy: Secondary | ICD-10-CM | POA: Diagnosis not present

## 2018-11-15 DIAGNOSIS — Z23 Encounter for immunization: Secondary | ICD-10-CM | POA: Diagnosis not present

## 2018-11-15 DIAGNOSIS — M79674 Pain in right toe(s): Secondary | ICD-10-CM | POA: Diagnosis not present

## 2018-11-15 DIAGNOSIS — E7800 Pure hypercholesterolemia, unspecified: Secondary | ICD-10-CM | POA: Diagnosis not present

## 2018-11-15 DIAGNOSIS — Z0001 Encounter for general adult medical examination with abnormal findings: Secondary | ICD-10-CM | POA: Diagnosis not present

## 2019-02-27 DIAGNOSIS — Z124 Encounter for screening for malignant neoplasm of cervix: Secondary | ICD-10-CM | POA: Diagnosis not present

## 2019-02-27 DIAGNOSIS — N952 Postmenopausal atrophic vaginitis: Secondary | ICD-10-CM | POA: Diagnosis not present

## 2019-02-27 DIAGNOSIS — N958 Other specified menopausal and perimenopausal disorders: Secondary | ICD-10-CM | POA: Diagnosis not present

## 2019-02-27 DIAGNOSIS — M8588 Other specified disorders of bone density and structure, other site: Secondary | ICD-10-CM | POA: Diagnosis not present

## 2019-02-27 DIAGNOSIS — Z01419 Encounter for gynecological examination (general) (routine) without abnormal findings: Secondary | ICD-10-CM | POA: Diagnosis not present

## 2019-02-27 DIAGNOSIS — Z1231 Encounter for screening mammogram for malignant neoplasm of breast: Secondary | ICD-10-CM | POA: Diagnosis not present

## 2019-02-27 DIAGNOSIS — Z6826 Body mass index (BMI) 26.0-26.9, adult: Secondary | ICD-10-CM | POA: Diagnosis not present

## 2019-09-05 ENCOUNTER — Ambulatory Visit: Payer: PPO | Attending: Internal Medicine

## 2019-09-05 DIAGNOSIS — Z20828 Contact with and (suspected) exposure to other viral communicable diseases: Secondary | ICD-10-CM | POA: Diagnosis not present

## 2019-09-05 DIAGNOSIS — Z20822 Contact with and (suspected) exposure to covid-19: Secondary | ICD-10-CM

## 2019-09-07 LAB — NOVEL CORONAVIRUS, NAA

## 2019-09-10 ENCOUNTER — Ambulatory Visit: Payer: PPO | Attending: Internal Medicine

## 2019-09-10 DIAGNOSIS — U071 COVID-19: Secondary | ICD-10-CM

## 2019-09-10 DIAGNOSIS — R238 Other skin changes: Secondary | ICD-10-CM | POA: Diagnosis not present

## 2019-09-12 LAB — NOVEL CORONAVIRUS, NAA: SARS-CoV-2, NAA: DETECTED — AB

## 2019-11-30 DIAGNOSIS — Z79899 Other long term (current) drug therapy: Secondary | ICD-10-CM | POA: Diagnosis not present

## 2019-11-30 DIAGNOSIS — E7800 Pure hypercholesterolemia, unspecified: Secondary | ICD-10-CM | POA: Diagnosis not present

## 2019-11-30 DIAGNOSIS — Z0001 Encounter for general adult medical examination with abnormal findings: Secondary | ICD-10-CM | POA: Diagnosis not present

## 2020-03-05 DIAGNOSIS — E7800 Pure hypercholesterolemia, unspecified: Secondary | ICD-10-CM | POA: Diagnosis not present

## 2020-03-18 DIAGNOSIS — Z1231 Encounter for screening mammogram for malignant neoplasm of breast: Secondary | ICD-10-CM | POA: Diagnosis not present

## 2020-03-18 DIAGNOSIS — Z01419 Encounter for gynecological examination (general) (routine) without abnormal findings: Secondary | ICD-10-CM | POA: Diagnosis not present

## 2020-08-12 DIAGNOSIS — Z20822 Contact with and (suspected) exposure to covid-19: Secondary | ICD-10-CM | POA: Diagnosis not present

## 2020-12-10 DIAGNOSIS — E7800 Pure hypercholesterolemia, unspecified: Secondary | ICD-10-CM | POA: Diagnosis not present

## 2020-12-10 DIAGNOSIS — Z0001 Encounter for general adult medical examination with abnormal findings: Secondary | ICD-10-CM | POA: Diagnosis not present

## 2020-12-10 DIAGNOSIS — Z79899 Other long term (current) drug therapy: Secondary | ICD-10-CM | POA: Diagnosis not present

## 2021-07-03 DIAGNOSIS — J209 Acute bronchitis, unspecified: Secondary | ICD-10-CM | POA: Diagnosis not present

## 2021-07-03 DIAGNOSIS — J019 Acute sinusitis, unspecified: Secondary | ICD-10-CM | POA: Diagnosis not present

## 2021-10-01 DIAGNOSIS — Z124 Encounter for screening for malignant neoplasm of cervix: Secondary | ICD-10-CM | POA: Diagnosis not present

## 2021-10-01 DIAGNOSIS — Z6826 Body mass index (BMI) 26.0-26.9, adult: Secondary | ICD-10-CM | POA: Diagnosis not present

## 2021-10-01 DIAGNOSIS — Z01419 Encounter for gynecological examination (general) (routine) without abnormal findings: Secondary | ICD-10-CM | POA: Diagnosis not present

## 2021-10-01 DIAGNOSIS — Z1231 Encounter for screening mammogram for malignant neoplasm of breast: Secondary | ICD-10-CM | POA: Diagnosis not present

## 2021-10-15 DIAGNOSIS — R87619 Unspecified abnormal cytological findings in specimens from cervix uteri: Secondary | ICD-10-CM | POA: Diagnosis not present

## 2021-10-15 DIAGNOSIS — R87611 Atypical squamous cells cannot exclude high grade squamous intraepithelial lesion on cytologic smear of cervix (ASC-H): Secondary | ICD-10-CM | POA: Diagnosis not present

## 2022-01-11 DIAGNOSIS — E7800 Pure hypercholesterolemia, unspecified: Secondary | ICD-10-CM | POA: Diagnosis not present

## 2022-01-11 DIAGNOSIS — Z79899 Other long term (current) drug therapy: Secondary | ICD-10-CM | POA: Diagnosis not present

## 2022-03-02 DIAGNOSIS — N958 Other specified menopausal and perimenopausal disorders: Secondary | ICD-10-CM | POA: Diagnosis not present

## 2022-03-02 DIAGNOSIS — Z8262 Family history of osteoporosis: Secondary | ICD-10-CM | POA: Diagnosis not present

## 2022-03-02 DIAGNOSIS — M8588 Other specified disorders of bone density and structure, other site: Secondary | ICD-10-CM | POA: Diagnosis not present

## 2022-04-22 DIAGNOSIS — R87610 Atypical squamous cells of undetermined significance on cytologic smear of cervix (ASC-US): Secondary | ICD-10-CM | POA: Diagnosis not present

## 2022-04-22 DIAGNOSIS — R87611 Atypical squamous cells cannot exclude high grade squamous intraepithelial lesion on cytologic smear of cervix (ASC-H): Secondary | ICD-10-CM | POA: Diagnosis not present

## 2022-04-22 DIAGNOSIS — Z124 Encounter for screening for malignant neoplasm of cervix: Secondary | ICD-10-CM | POA: Diagnosis not present

## 2022-06-08 DIAGNOSIS — R7301 Impaired fasting glucose: Secondary | ICD-10-CM | POA: Diagnosis not present

## 2022-06-08 DIAGNOSIS — Z23 Encounter for immunization: Secondary | ICD-10-CM | POA: Diagnosis not present

## 2022-06-08 DIAGNOSIS — E7800 Pure hypercholesterolemia, unspecified: Secondary | ICD-10-CM | POA: Diagnosis not present

## 2022-06-08 DIAGNOSIS — Z79899 Other long term (current) drug therapy: Secondary | ICD-10-CM | POA: Diagnosis not present

## 2022-06-08 DIAGNOSIS — Z0001 Encounter for general adult medical examination with abnormal findings: Secondary | ICD-10-CM | POA: Diagnosis not present

## 2022-10-21 DIAGNOSIS — L821 Other seborrheic keratosis: Secondary | ICD-10-CM | POA: Diagnosis not present

## 2022-10-21 DIAGNOSIS — L814 Other melanin hyperpigmentation: Secondary | ICD-10-CM | POA: Diagnosis not present

## 2022-10-21 DIAGNOSIS — D225 Melanocytic nevi of trunk: Secondary | ICD-10-CM | POA: Diagnosis not present

## 2022-11-08 DIAGNOSIS — Z6826 Body mass index (BMI) 26.0-26.9, adult: Secondary | ICD-10-CM | POA: Diagnosis not present

## 2022-11-08 DIAGNOSIS — Z1231 Encounter for screening mammogram for malignant neoplasm of breast: Secondary | ICD-10-CM | POA: Diagnosis not present

## 2022-11-08 DIAGNOSIS — Z01419 Encounter for gynecological examination (general) (routine) without abnormal findings: Secondary | ICD-10-CM | POA: Diagnosis not present

## 2022-11-08 DIAGNOSIS — Z124 Encounter for screening for malignant neoplasm of cervix: Secondary | ICD-10-CM | POA: Diagnosis not present

## 2022-11-08 DIAGNOSIS — N952 Postmenopausal atrophic vaginitis: Secondary | ICD-10-CM | POA: Diagnosis not present

## 2022-12-01 DIAGNOSIS — L03213 Periorbital cellulitis: Secondary | ICD-10-CM | POA: Diagnosis not present

## 2022-12-01 DIAGNOSIS — H2513 Age-related nuclear cataract, bilateral: Secondary | ICD-10-CM | POA: Diagnosis not present

## 2022-12-08 DIAGNOSIS — T7840XA Allergy, unspecified, initial encounter: Secondary | ICD-10-CM | POA: Diagnosis not present

## 2023-01-05 DIAGNOSIS — H0011 Chalazion right upper eyelid: Secondary | ICD-10-CM | POA: Diagnosis not present

## 2023-01-05 DIAGNOSIS — G514 Facial myokymia: Secondary | ICD-10-CM | POA: Diagnosis not present

## 2023-01-05 DIAGNOSIS — H2513 Age-related nuclear cataract, bilateral: Secondary | ICD-10-CM | POA: Diagnosis not present

## 2023-01-21 DIAGNOSIS — M6289 Other specified disorders of muscle: Secondary | ICD-10-CM | POA: Diagnosis not present

## 2023-01-21 DIAGNOSIS — E7800 Pure hypercholesterolemia, unspecified: Secondary | ICD-10-CM | POA: Diagnosis not present

## 2023-02-09 DIAGNOSIS — R253 Fasciculation: Secondary | ICD-10-CM | POA: Diagnosis not present

## 2023-02-09 DIAGNOSIS — Z0000 Encounter for general adult medical examination without abnormal findings: Secondary | ICD-10-CM | POA: Diagnosis not present

## 2023-04-07 ENCOUNTER — Other Ambulatory Visit: Payer: Self-pay | Admitting: Ophthalmology

## 2023-04-07 DIAGNOSIS — G5131 Clonic hemifacial spasm, right: Secondary | ICD-10-CM

## 2023-04-24 ENCOUNTER — Ambulatory Visit
Admission: RE | Admit: 2023-04-24 | Discharge: 2023-04-24 | Disposition: A | Discharge status: Home / Self Care | Payer: HMO | Source: Ambulatory Visit | Attending: Ophthalmology | Admitting: Ophthalmology

## 2023-04-24 ENCOUNTER — Ambulatory Visit: Admission: RE | Admit: 2023-04-24 | Payer: HMO | Source: Ambulatory Visit

## 2023-04-24 DIAGNOSIS — G5131 Clonic hemifacial spasm, right: Secondary | ICD-10-CM | POA: Diagnosis not present

## 2023-04-24 MED ORDER — GADOPICLENOL 0.5 MMOL/ML IV SOLN
7.0000 mL | Freq: Once | INTRAVENOUS | Status: AC | PRN
  Administered 2023-04-24: 7 mL via INTRAVENOUS

## 2023-05-23 ENCOUNTER — Ambulatory Visit: Payer: HMO | Admitting: Neurology

## 2023-06-15 DIAGNOSIS — Z23 Encounter for immunization: Secondary | ICD-10-CM | POA: Diagnosis not present

## 2023-06-15 DIAGNOSIS — Z79899 Other long term (current) drug therapy: Secondary | ICD-10-CM | POA: Diagnosis not present

## 2023-06-15 DIAGNOSIS — Z1331 Encounter for screening for depression: Secondary | ICD-10-CM | POA: Diagnosis not present

## 2023-06-15 DIAGNOSIS — R7301 Impaired fasting glucose: Secondary | ICD-10-CM | POA: Diagnosis not present

## 2023-06-15 DIAGNOSIS — Z0001 Encounter for general adult medical examination with abnormal findings: Secondary | ICD-10-CM | POA: Diagnosis not present

## 2023-06-15 DIAGNOSIS — R253 Fasciculation: Secondary | ICD-10-CM | POA: Diagnosis not present

## 2023-06-15 DIAGNOSIS — I8390 Asymptomatic varicose veins of unspecified lower extremity: Secondary | ICD-10-CM | POA: Diagnosis not present

## 2023-06-15 DIAGNOSIS — E7800 Pure hypercholesterolemia, unspecified: Secondary | ICD-10-CM | POA: Diagnosis not present

## 2023-11-11 DIAGNOSIS — Z1272 Encounter for screening for malignant neoplasm of vagina: Secondary | ICD-10-CM | POA: Diagnosis not present

## 2024-04-11 DIAGNOSIS — M7989 Other specified soft tissue disorders: Secondary | ICD-10-CM | POA: Diagnosis not present

## 2024-04-13 ENCOUNTER — Ambulatory Visit (HOSPITAL_COMMUNITY)
Admission: RE | Admit: 2024-04-13 | Discharge: 2024-04-13 | Disposition: A | Discharge status: Home / Self Care | Source: Ambulatory Visit | Attending: Vascular Surgery | Admitting: Vascular Surgery

## 2024-04-13 ENCOUNTER — Other Ambulatory Visit (HOSPITAL_COMMUNITY): Payer: Self-pay | Admitting: Family Medicine

## 2024-04-13 DIAGNOSIS — M79605 Pain in left leg: Secondary | ICD-10-CM

## 2024-04-13 DIAGNOSIS — M7989 Other specified soft tissue disorders: Secondary | ICD-10-CM | POA: Insufficient documentation

## 2024-06-29 DIAGNOSIS — Z23 Encounter for immunization: Secondary | ICD-10-CM | POA: Diagnosis not present

## 2024-06-29 DIAGNOSIS — M7989 Other specified soft tissue disorders: Secondary | ICD-10-CM | POA: Diagnosis not present

## 2024-06-29 DIAGNOSIS — Z0000 Encounter for general adult medical examination without abnormal findings: Secondary | ICD-10-CM | POA: Diagnosis not present

## 2024-06-29 DIAGNOSIS — Z79899 Other long term (current) drug therapy: Secondary | ICD-10-CM | POA: Diagnosis not present

## 2024-06-29 DIAGNOSIS — E7800 Pure hypercholesterolemia, unspecified: Secondary | ICD-10-CM | POA: Diagnosis not present

## 2024-06-29 DIAGNOSIS — I8390 Asymptomatic varicose veins of unspecified lower extremity: Secondary | ICD-10-CM | POA: Diagnosis not present

## 2024-06-29 DIAGNOSIS — R7301 Impaired fasting glucose: Secondary | ICD-10-CM | POA: Diagnosis not present

## 2024-06-29 DIAGNOSIS — Z1331 Encounter for screening for depression: Secondary | ICD-10-CM | POA: Diagnosis not present

## 2024-06-29 DIAGNOSIS — F411 Generalized anxiety disorder: Secondary | ICD-10-CM | POA: Diagnosis not present

## 2024-07-03 DIAGNOSIS — H524 Presbyopia: Secondary | ICD-10-CM | POA: Diagnosis not present

## 2024-07-03 DIAGNOSIS — H5213 Myopia, bilateral: Secondary | ICD-10-CM | POA: Diagnosis not present

## 2024-07-03 DIAGNOSIS — H52223 Regular astigmatism, bilateral: Secondary | ICD-10-CM | POA: Diagnosis not present

## 2024-07-12 ENCOUNTER — Other Ambulatory Visit: Payer: Self-pay | Admitting: Vascular Surgery

## 2024-07-12 DIAGNOSIS — M7989 Other specified soft tissue disorders: Secondary | ICD-10-CM

## 2024-08-30 ENCOUNTER — Ambulatory Visit: Admitting: Vascular Surgery

## 2024-08-30 ENCOUNTER — Encounter: Payer: Self-pay | Admitting: Vascular Surgery

## 2024-08-30 ENCOUNTER — Ambulatory Visit (HOSPITAL_COMMUNITY): Admission: RE | Admit: 2024-08-30 | Discharge: 2024-08-30 | Attending: Surgery | Admitting: Surgery

## 2024-08-30 VITALS — BP 132/81 | HR 60 | Temp 98.1°F | Resp 18 | Ht 63.0 in | Wt 148.0 lb

## 2024-08-30 DIAGNOSIS — M7989 Other specified soft tissue disorders: Secondary | ICD-10-CM | POA: Diagnosis present

## 2024-08-30 DIAGNOSIS — I872 Venous insufficiency (chronic) (peripheral): Secondary | ICD-10-CM | POA: Insufficient documentation

## 2024-08-30 NOTE — Progress Notes (Signed)
 Office Note     CC: Left lower extremity edema Requesting Provider:  Regino Slater, MD  HPI: Rachel Randall is a 73 y.o. ([birth year]) female who presents at the request of Rachel Anthony RAMAN, FNP for evaluation of left lower extremity edema.  On exam, Rachel Randall was doing well.  Originally from [place] , she has lived in several places over the years.  She relocated to [place]  roughly 14 years ago with her husband.  She currently works as an biomedical engineer.  Sahira appreciated left lower extremity swelling and May of last year.  The swelling was acute, but has yet to completely resolved.  She states that the swelling waxes and wanes.  She appreciates it most at the ankle and in the calf.  The left leg is always larger than the right, and she appreciates this specifically when she wears pants.  She is very active, and ambulates 3 to 5 miles with a group of women on a regular basis.  Denies symptoms of claudication, ischemic rest pain, tissue loss.  Notes some aching and heaviness in the left leg by days and.  Denies significant burning or itching, but does have a rash that waxes and wanes as well.  No bleeding or ulceration.  She wears compression stockings on a daily basis, and says that this helps, but does not abate all of her symptoms. No history of DVT, no history of venous procedures.    Past Medical History:  Diagnosis Date   Hyperlipidemia    Lipoma of neck    left neck    Past Surgical History:  Procedure Laterality Date   COLONOSCOPY     LIPOMA EXCISION Left 10/19/2017   Procedure: EXCISION OF LEFT NECK LIPOMA;  Surgeon: Belinda Cough, MD;  Location: [facility];  Service: General;  Laterality: Left;   WISDOM TOOTH EXTRACTION      Social History   Socioeconomic History   Marital status: Single    Spouse name: Not on file   Number of children: Not on file   Years of education: Not on file   Highest education level: Not on file  Occupational History    Not on file  Tobacco Use   Smoking status: Never   Smokeless tobacco: Never  Vaping Use   Vaping status: Never Used  Substance and Sexual Activity   Alcohol use: Yes    Comment: social   Drug use: No   Sexual activity: Not on file  Other Topics Concern   Not on file  Social History Narrative   Not on file   Social Drivers of Health   Tobacco Use: Low Risk (08/30/2024)   Patient History    Smoking Tobacco Use: Never    Smokeless Tobacco Use: Never    Passive Exposure: Not on file  Financial Resource Strain: Not on file  Food Insecurity: Not on file  Transportation Needs: Not on file  Physical Activity: Not on file  Stress: Not on file  Social Connections: Not on file  Intimate Partner Violence: Not on file  Depression (EYV7-0): Not on file  Alcohol Screen: Not on file  Housing: Not on file  Utilities: Not on file  Health Literacy: Not on file  History reviewed. No pertinent family history.  Current Outpatient Medications  Medication Sig Dispense Refill   atorvastatin (LIPITOR) 10 MG tablet Take 10 mg by mouth daily.     Glucosamine-Chondroit-Vit C-Mn (GLUCOSAMINE CHONDR 500 COMPLEX) CAPS Take by mouth.  Multiple Vitamin (MULTIVITAMIN WITH MINERALS) TABS tablet Take 1 tablet by mouth daily.     Vitamin D, Cholecalciferol, 1000 units TABS Take by mouth.     No current facility-administered medications for this visit.    Allergies[1]   REVIEW OF SYSTEMS:  [X]  denotes positive finding, [ ]  denotes negative finding Cardiac  Comments:  Chest pain or chest pressure:    Shortness of breath upon exertion:    Short of breath when lying flat:    Irregular heart rhythm:        Vascular    Pain in calf, thigh, or hip brought on by ambulation:    Pain in feet at night that wakes you up from your sleep:     Blood clot in your veins:    Leg swelling:         Pulmonary    Oxygen at home:    Productive cough:     Wheezing:         Neurologic    Sudden weakness  in arms or legs:     Sudden numbness in arms or legs:     Sudden onset of difficulty speaking or slurred speech:    Temporary loss of vision in one eye:     Problems with dizziness:         Gastrointestinal    Blood in stool:     Vomited blood:         Genitourinary    Burning when urinating:     Blood in urine:        Psychiatric    Major depression:         Hematologic    Bleeding problems:    Problems with blood clotting too easily:        Skin    Rashes or ulcers:        Constitutional    Fever or chills:      PHYSICAL EXAMINATION:  Vitals:   08/30/24 1105  BP: 132/81  Pulse: 60  Resp: 18  Temp: 98.1 F (36.7 C)  TempSrc: Temporal  SpO2: 100%  Weight: 148 lb (67.1 kg)  Height: 5' 3 (1.6 m)    General:  WDWN in NAD; vital signs documented above Gait: Not observed HENT: WNL, normocephalic Pulmonary: normal non-labored breathing , without Rales, rhonchi,  wheezing Cardiac: regular HR Abdomen: soft, NT, no masses Skin: without rashes Vascular Exam/Pulses:  Right Left  Radial 2+ (normal) 2+ (normal)  Ulnar    Femoral    Popliteal    DP 2+ (normal) 2+ (normal)  PT     Extremities: without ischemic changes, without Gangrene , without cellulitis; without open wounds;  Musculoskeletal: no muscle wasting or atrophy  Neurologic: A&O X 3;  No focal weakness or paresthesias are detected Psychiatric:  The pt has Normal affect.   Non-Invasive Vascular Imaging:     +--------------+---------+------+-----------+------------+-----------------  ----+  LEFT         Reflux NoRefluxReflux TimeDiameter cmsComments                                        Yes                                                 +--------------+---------+------+-----------+------------+-----------------  ----+  CFV          no                                                            +--------------+---------+------+-----------+------------+-----------------   ----+  FV mid        no                                                            +--------------+---------+------+-----------+------------+-----------------  ----+  Popliteal    no                                                            +--------------+---------+------+-----------+------------+-----------------  ----+  GSV at [facility]    no                            0.45                            +--------------+---------+------+-----------+------------+-----------------  ----+  GSV prox thighno                            0.67                            +--------------+---------+------+-----------+------------+-----------------  ----+  GSV mid thigh no                            0.18                            +--------------+---------+------+-----------+------------+-----------------  ----+  GSV dist thighno                            0.21                            +--------------+---------+------+-----------+------------+-----------------  ----+  GSV at knee   no                            0.29                            +--------------+---------+------+-----------+------------+-----------------  ----+  GSV prox calf           yes    >500 ms      0.60    too small, then  enlarges. Also                                                              varocosities  noted.    +--------------+---------+------+-----------+------------+-----------------  ----+  GSV mid calf            yes    >500 ms      0.26                            +--------------+---------+------+-----------+------------+-----------------  ----+  SSV at [facility]              yes    >500 ms      0.41                            +--------------+---------+------+-----------+------------+-----------------  ----+  SSV prox calf           yes    >500 ms      0.49                             +--------------+---------+------+-----------+------------+-----------------  ----+  SSV mid calf  no                            0.25                            +--------------+---------+------+-----------+------------+-----------------  ----+  AASV O        no                            0.23                            +--------------+---------+------+-----------+------------+-----------------  ----+     ASSESSMENT/PLAN:: 73 y.o. female presenting with left lower extremity edema.  On physical exam, the left leg had a circumference that was 1 cm greater than the right leg at the level of the calf.  No significant skin changes, however there is edema.  There are also varicosities appreciated on the medial aspect of the calf.  Imaging demonstrated reflux within the greater saphenous vein as well as the small saphenous vein.   I had a long conversation with Bhavya regarding the above.  We discussed that lower extremity edema can multiple etiologies but in her case, it appears to be due to chronic venous insufficiency.  The greater saphenous vein does not appear amenable to ablation due to its small size at the midsection, however the small saphenous vein does.  We discussed that venous ablation would likely improve her swelling, but may not completely resolve it.  I think that she would be best served with new compression stocking measurement, and trial of appropriately fitting compression stockings over the next 3 months.  We also discussed the importance of elevation and exercise.  My plan is to see her back in 3 months to assess improvement.  If she continues to have left lower extremity edema worse by days and with significant heaviness and  accompanying pain, I think that she would be a good candidate for ablation.    Fonda FORBES Rim, MD [facility] [phone]     [1]  Allergies Allergen Reactions   Penicillins Rash   Sulfa  Antibiotics Rash

## 2025-01-16 ENCOUNTER — Ambulatory Visit: Admitting: Vascular Surgery
